# Patient Record
Sex: Female | Born: 1985 | Race: White | Hispanic: No | Marital: Married | Smoking: Never smoker
Health system: Southern US, Community
[De-identification: ages and names within clinical notes are randomized; demographics above are authoritative.]

---

## 2014-02-16 ENCOUNTER — Encounter (HOSPITAL_BASED_OUTPATIENT_CLINIC_OR_DEPARTMENT_OTHER): Payer: Self-pay | Admitting: Emergency Medicine

## 2014-02-16 ENCOUNTER — Emergency Department (HOSPITAL_BASED_OUTPATIENT_CLINIC_OR_DEPARTMENT_OTHER): Payer: Federal, State, Local not specified - PPO

## 2014-02-16 ENCOUNTER — Emergency Department (HOSPITAL_BASED_OUTPATIENT_CLINIC_OR_DEPARTMENT_OTHER)
Admission: EM | Admit: 2014-02-16 | Discharge: 2014-02-16 | Disposition: A | Discharge status: Home / Self Care | Payer: Federal, State, Local not specified - PPO | Attending: Emergency Medicine | Admitting: Emergency Medicine

## 2014-02-16 DIAGNOSIS — K5900 Constipation, unspecified: Secondary | ICD-10-CM | POA: Insufficient documentation

## 2014-02-16 DIAGNOSIS — R110 Nausea: Secondary | ICD-10-CM | POA: Insufficient documentation

## 2014-02-16 DIAGNOSIS — R109 Unspecified abdominal pain: Secondary | ICD-10-CM | POA: Insufficient documentation

## 2014-02-16 DIAGNOSIS — K219 Gastro-esophageal reflux disease without esophagitis: Secondary | ICD-10-CM | POA: Insufficient documentation

## 2014-02-16 DIAGNOSIS — Z3202 Encounter for pregnancy test, result negative: Secondary | ICD-10-CM | POA: Insufficient documentation

## 2014-02-16 DIAGNOSIS — R1915 Other abnormal bowel sounds: Secondary | ICD-10-CM | POA: Insufficient documentation

## 2014-02-16 DIAGNOSIS — F411 Generalized anxiety disorder: Secondary | ICD-10-CM | POA: Insufficient documentation

## 2014-02-16 LAB — CBC WITH DIFFERENTIAL/PLATELET
BASOS ABS: 0 10*3/uL (ref 0.0–0.1)
Basophils Relative: 0 % (ref 0–1)
EOS PCT: 2 % (ref 0–5)
Eosinophils Absolute: 0.1 10*3/uL (ref 0.0–0.7)
HCT: 36.2 % (ref 36.0–46.0)
Hemoglobin: 11.7 g/dL — ABNORMAL LOW (ref 12.0–15.0)
LYMPHS ABS: 2.4 10*3/uL (ref 0.7–4.0)
LYMPHS PCT: 33 % (ref 12–46)
MCH: 28.3 pg (ref 26.0–34.0)
MCHC: 32.3 g/dL (ref 30.0–36.0)
MCV: 87.7 fL (ref 78.0–100.0)
Monocytes Absolute: 0.9 10*3/uL (ref 0.1–1.0)
Monocytes Relative: 13 % — ABNORMAL HIGH (ref 3–12)
NEUTROS ABS: 3.9 10*3/uL (ref 1.7–7.7)
Neutrophils Relative %: 53 % (ref 43–77)
PLATELETS: 129 10*3/uL — AB (ref 150–400)
RBC: 4.13 MIL/uL (ref 3.87–5.11)
RDW: 12.8 % (ref 11.5–15.5)
WBC: 7.4 10*3/uL (ref 4.0–10.5)

## 2014-02-16 LAB — TROPONIN I: Troponin I: <0.3 ng/mL (ref <0.30)

## 2014-02-16 LAB — COMPREHENSIVE METABOLIC PANEL
ALT: 10 U/L (ref 0–35)
AST: 16 U/L (ref 0–37)
Albumin: 4.2 g/dL (ref 3.5–5.2)
Alkaline Phosphatase: 55 U/L (ref 39–117)
BILIRUBIN TOTAL: 0.7 mg/dL (ref 0.3–1.2)
BUN: 12 mg/dL (ref 6–23)
CALCIUM: 9.2 mg/dL (ref 8.4–10.5)
CO2: 24 meq/L (ref 19–32)
Chloride: 104 mEq/L (ref 96–112)
Creatinine, Ser: 0.7 mg/dL (ref 0.50–1.10)
GLUCOSE: 93 mg/dL (ref 70–99)
Potassium: 3.8 mEq/L (ref 3.7–5.3)
SODIUM: 140 meq/L (ref 137–147)
Total Protein: 7.3 g/dL (ref 6.0–8.3)

## 2014-02-16 LAB — PREGNANCY, URINE: PREG TEST UR: NEGATIVE

## 2014-02-16 LAB — LIPASE, BLOOD: Lipase: 21 U/L (ref 11–59)

## 2014-02-16 MED ORDER — DICYCLOMINE HCL 10 MG/ML IM SOLN
20.0000 mg | Freq: Once | INTRAMUSCULAR | Status: AC
  Administered 2014-02-16: 20 mg via INTRAMUSCULAR
  Filled 2014-02-16: qty 2

## 2014-02-16 MED ORDER — SUCRALFATE 1 GM/10ML PO SUSP
1.0000 g | Freq: Three times a day (TID) | ORAL | Status: DC
Start: 2014-02-16 — End: 2017-08-16

## 2014-02-16 MED ORDER — GI COCKTAIL ~~LOC~~
30.0000 mL | Freq: Once | ORAL | Status: AC
  Administered 2014-02-16: 30 mL via ORAL
  Filled 2014-02-16: qty 30

## 2014-02-16 MED ORDER — OMEPRAZOLE 20 MG PO CPDR: 20.0000 mg | DELAYED_RELEASE_CAPSULE | Freq: Every day | ORAL | Status: DC

## 2014-02-16 MED ORDER — ONDANSETRON 8 MG PO TBDP
8.0000 mg | ORAL_TABLET | Freq: Once | ORAL | Status: AC
  Administered 2014-02-16: 8 mg via ORAL
  Filled 2014-02-16: qty 1

## 2014-02-16 NOTE — ED Notes (Signed)
Intermittent mid chest pain, onset today, radiates to the back between the scapulas. Nausea/bloating/burping gas all week. Hx of heart palpitation in the past, has a cardiologist. Sinus rhythm, VSS, Chest non-tender at palpation.

## 2014-02-16 NOTE — ED Provider Notes (Signed)
CSN: [id number]     Arrival date & time 02/16/14  4098 History   First MD Initiated Contact with Patient 02/16/14 [phone]     Chief Complaint  Patient presents with  . Chest Pain     (Consider location/radiation/quality/duration/timing/severity/associated sxs/prior Treatment) Patient is a 28 y.o. female presenting with chest pain. The history is provided by the patient.  Chest Pain Pain location:  Epigastric Pain quality: sharp   Pain radiates to:  Mid back Pain severity:  Moderate Onset quality:  Gradual Duration:  5 days Timing:  Constant Progression:  Unchanged Context: no stress   Relieved by:  Nothing Worsened by:  Nothing tried Ineffective treatments:  None tried Associated symptoms: nausea   Associated symptoms: no cough, no diaphoresis, no lower extremity edema, no palpitations, no shortness of breath and not vomiting   Nausea:    Severity:  Severe   Onset quality:  Gradual   Duration:  1 week   Timing:  Constant   Progression:  Unchanged Risk factors: no birth control, no high cholesterol, no smoking and no surgery   Bloating and gassy x1 week.  Had palpitations and similar symptoms in October and was seen by cardiology who told her her cholesterol EKK and tests were normal.    History reviewed. No pertinent past medical history. Past Surgical History  Procedure Laterality Date  . Cesarean section     No family history on file. History  Substance Use Topics  . Smoking status: Never Smoker   . Smokeless tobacco: Not on file  . Alcohol Use: No   OB History   Grav Para Term Preterm Abortions TAB SAB Ect Mult Living                 Review of Systems  Constitutional: Negative for diaphoresis.  Respiratory: Negative for cough, chest tightness and shortness of breath.   Cardiovascular: Positive for chest pain. Negative for palpitations and leg swelling.  Gastrointestinal: Positive for nausea and abdominal distention. Negative for vomiting, diarrhea and  constipation.  All other systems reviewed and are negative.      Allergies  Review of patient's allergies indicates no known allergies.  Home Medications  No current outpatient prescriptions on file. BP 127/71  Pulse 79  Temp(Src) 98.3 F (36.8 C) (Oral)  Resp 25  SpO2 100% Physical Exam  Constitutional: She is oriented to person, place, and time. She appears well-developed and well-nourished. No distress.  HENT:  Head: Normocephalic and atraumatic.  Mouth/Throat: Oropharynx is clear and moist.  Eyes: Conjunctivae are normal. Pupils are equal, round, and reactive to light.  Neck: Normal range of motion. Neck supple.  Cardiovascular: Normal rate, regular rhythm and intact distal pulses.   Pulmonary/Chest: Effort normal and breath sounds normal. She has no wheezes. She has no rales. She exhibits no tenderness.  Abdominal: Soft. Bowel sounds are increased. There is no tenderness. There is no rebound and no guarding.  Musculoskeletal: Normal range of motion.  Neurological: She is alert and oriented to person, place, and time.  Skin: Skin is warm and dry.  Psychiatric: Her mood appears anxious.    ED Course  Procedures (including critical care time) Labs Review Labs Reviewed  PREGNANCY, URINE  CBC WITH DIFFERENTIAL  COMPREHENSIVE METABOLIC PANEL  LIPASE, BLOOD   Imaging Review No results found.   EKG Interpretation None      MDM  PERC negative wells 0- doubt PE no risk factors  Final diagnoses:  None   Do not  think this patient's symptoms are cardiac in nature.  In the setting of negative EKG and troponin ACS is excluded.  Symptoms are consistent with GERD.  Follow up with your PMD for ongoing care.      Date: 02/16/2014  Rate: 79  Rhythm: normal sinus rhythm  QRS Axis: normal  Intervals: normal  ST/T Wave abnormalities: normal  Conduction Disutrbances: none  Narrative Interpretation: unremarkable       Zakyia Gagan K Jovonda Selner-Rasch, MD 02/16/14 [phone]

## 2015-08-27 IMAGING — CR DG ABDOMEN ACUTE W/ 1V CHEST
3 series · 3 of 3 positions shown · non-contrast
Comparison: Insert none of the

CLINICAL DATA: Nausea, pain.

EXAM:
ACUTE ABDOMEN SERIES (ABDOMEN 2 VIEW & CHEST 1 VIEW)

[w chest pa]
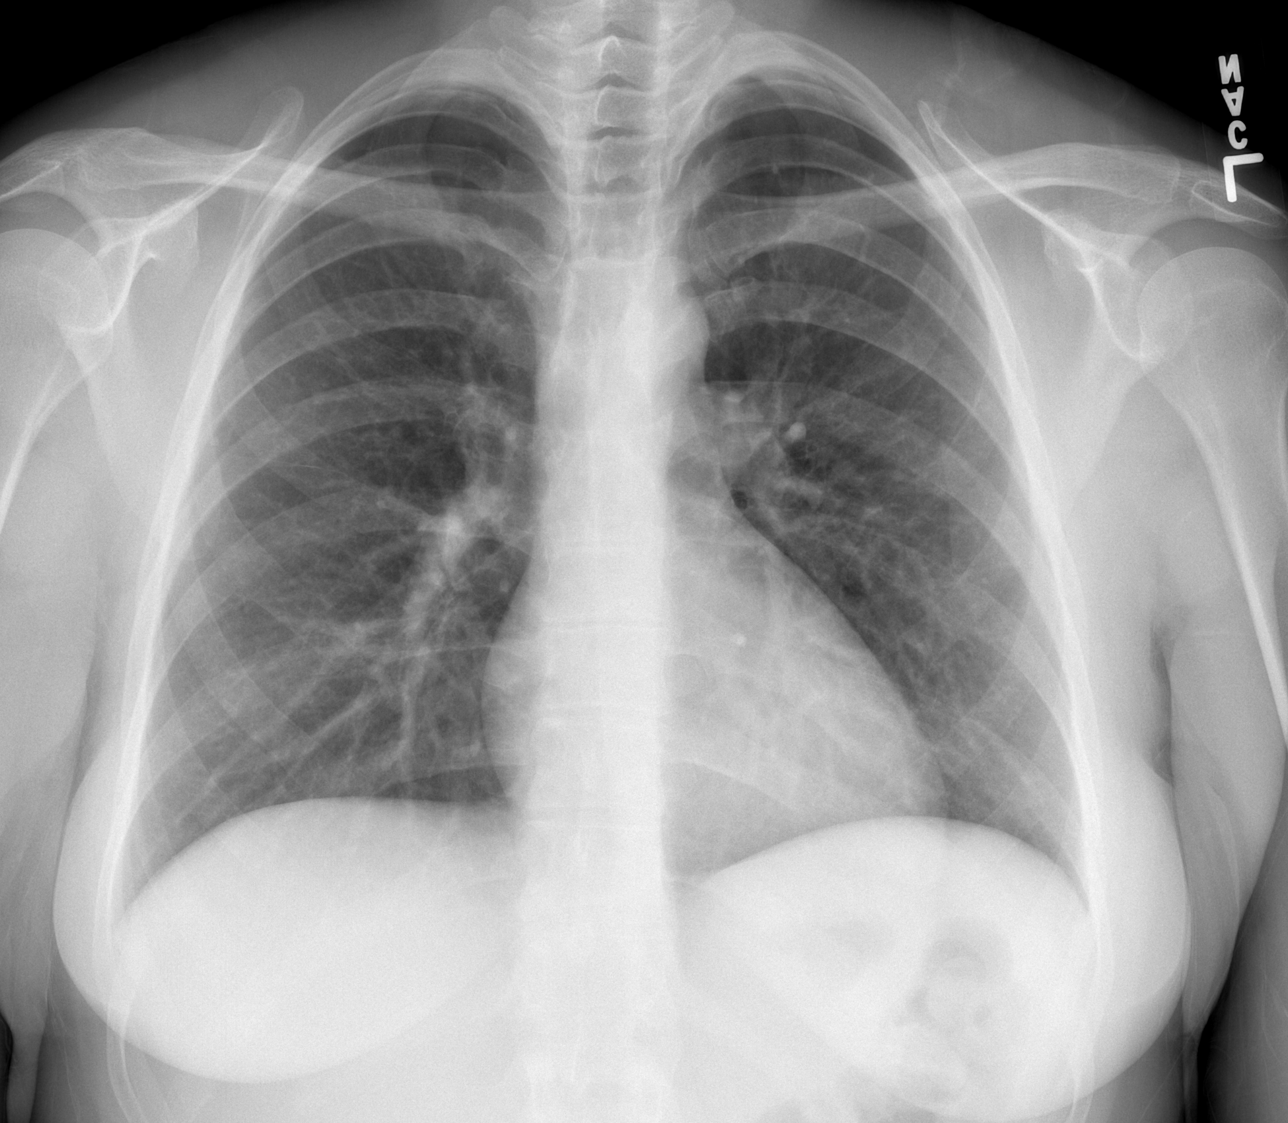

[w abdomen upright]
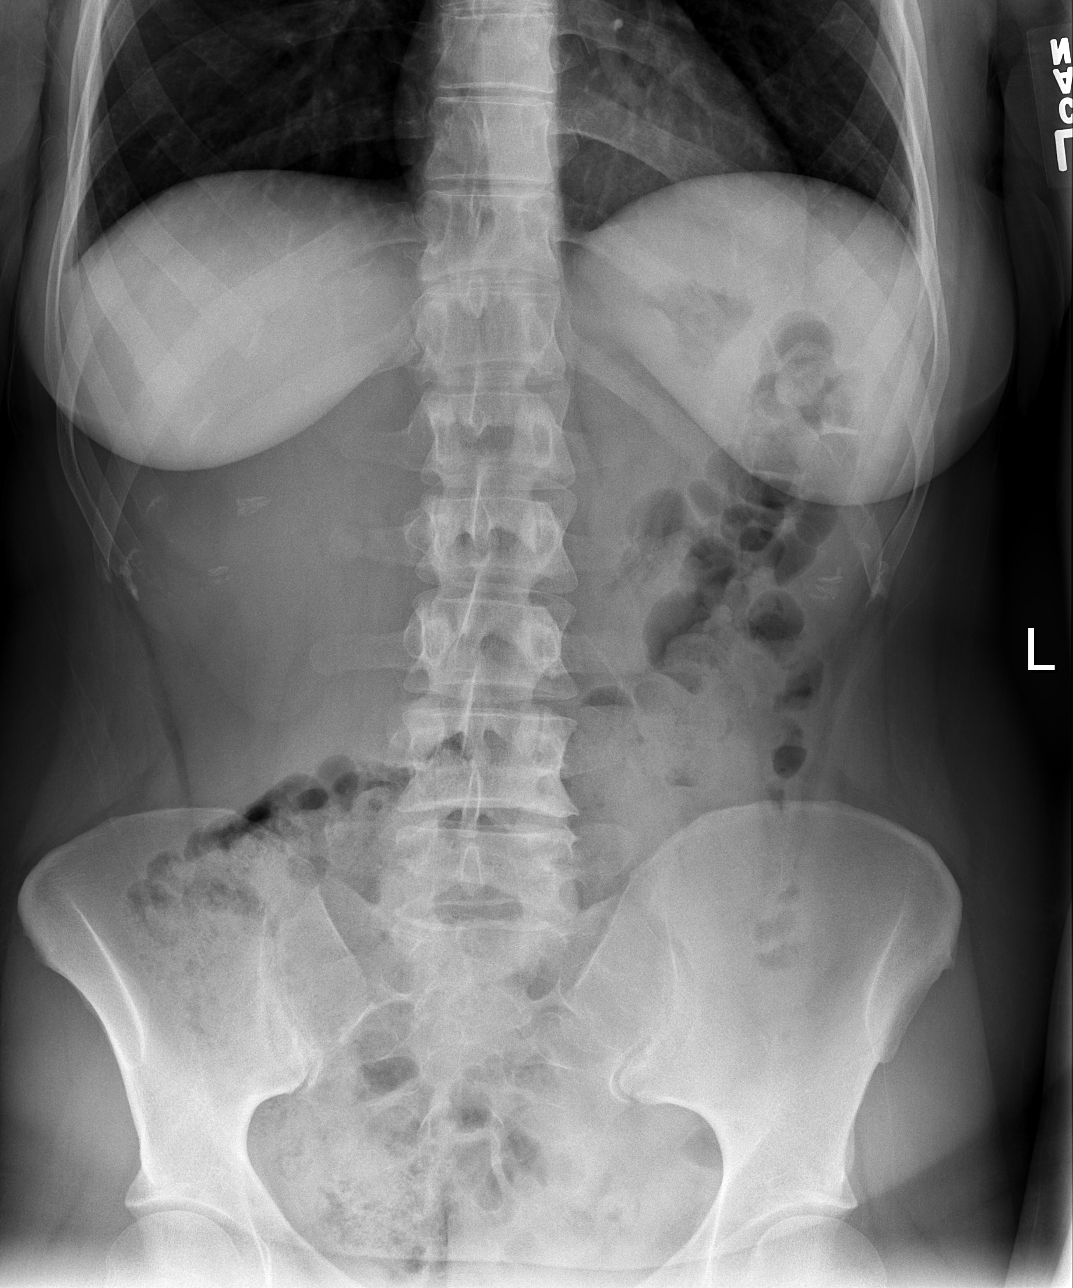

[t abdomen supine]
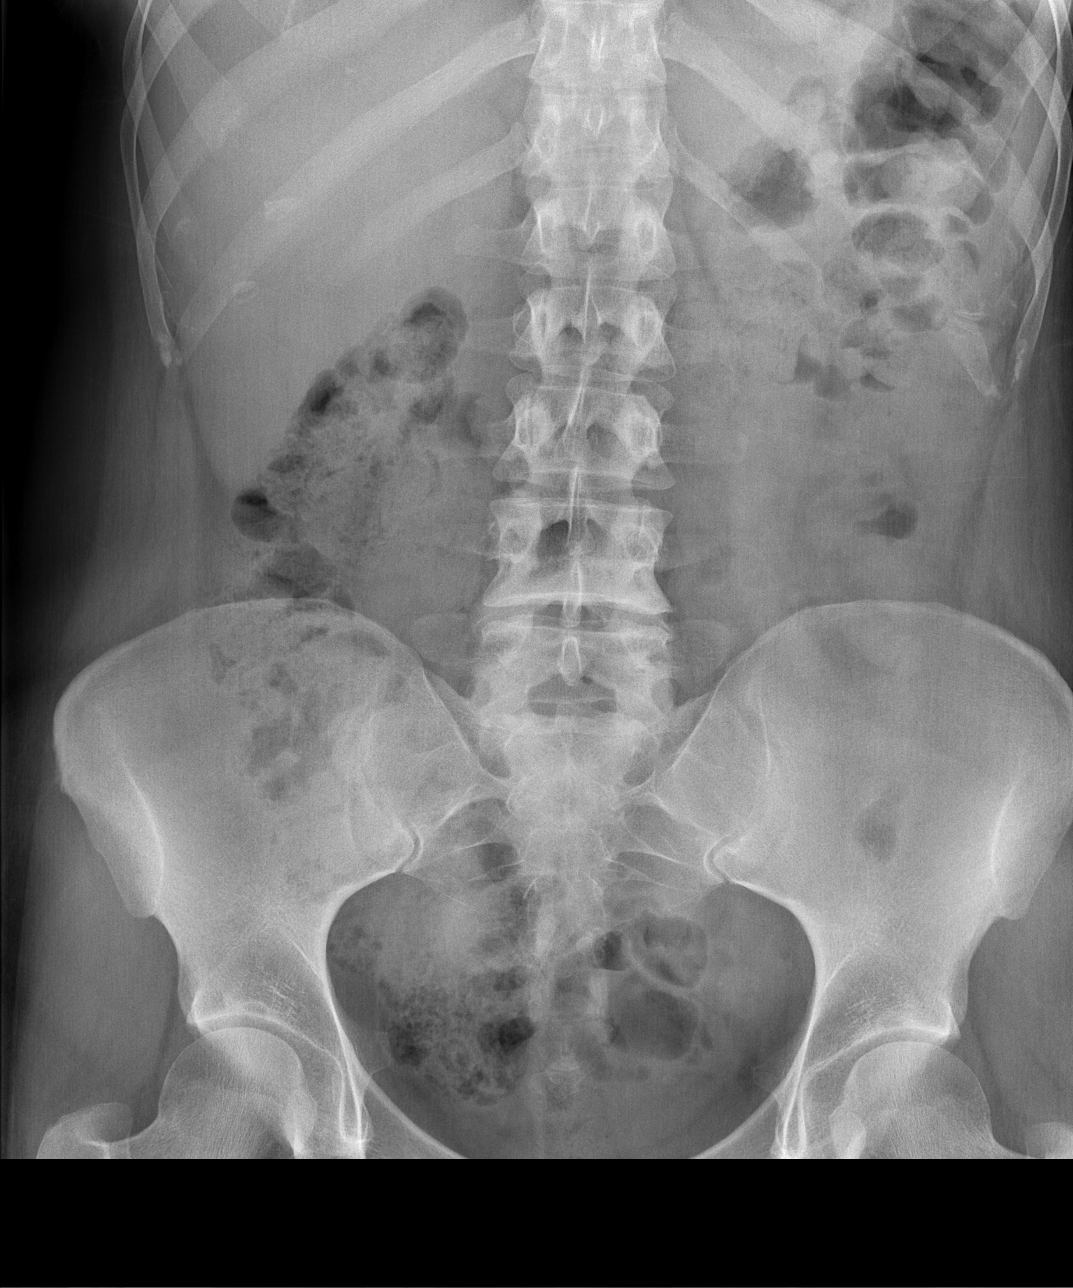

[3 of 3 positions shown; findings below may reference images not displayed]

FINDINGS: There is no evidence of dilated bowel loops or free intraperitoneal
air. No radiopaque calculi or other significant radiographic
abnormality is seen. Heart size and mediastinal contours are within
normal limits. Both lungs are clear.
IMPRESSION: Negative abdominal radiographs.  No acute cardiopulmonary disease.

  By: Md Ashiqul Uzzol

## 2017-08-16 ENCOUNTER — Encounter (HOSPITAL_BASED_OUTPATIENT_CLINIC_OR_DEPARTMENT_OTHER): Payer: Self-pay

## 2017-08-16 ENCOUNTER — Emergency Department (HOSPITAL_BASED_OUTPATIENT_CLINIC_OR_DEPARTMENT_OTHER)
Admission: EM | Admit: 2017-08-16 | Discharge: 2017-08-16 | Disposition: A | Discharge status: Home / Self Care | Payer: Federal, State, Local not specified - PPO | Attending: Emergency Medicine | Admitting: Emergency Medicine

## 2017-08-16 DIAGNOSIS — R1032 Left lower quadrant pain: Secondary | ICD-10-CM | POA: Insufficient documentation

## 2017-08-16 LAB — URINALYSIS, ROUTINE W REFLEX MICROSCOPIC
Bilirubin Urine: NEGATIVE
GLUCOSE, UA: NEGATIVE mg/dL
Hgb urine dipstick: NEGATIVE
Ketones, ur: NEGATIVE mg/dL
Leukocytes, UA: NEGATIVE
Nitrite: NEGATIVE
Protein, ur: NEGATIVE mg/dL
pH: 6.5 (ref 5.0–8.0)

## 2017-08-16 LAB — PREGNANCY, URINE: Preg Test, Ur: NEGATIVE

## 2017-08-16 MED ORDER — AMOXICILLIN-POT CLAVULANATE 875-125 MG PO TABS: 1.0000 | ORAL_TABLET | Freq: Two times a day (BID) | ORAL | 0 refills | Status: AC

## 2017-08-16 MED ORDER — ONDANSETRON 4 MG PO TBDP: ORAL_TABLET | ORAL | 0 refills | Status: AC

## 2017-08-16 MED ORDER — MORPHINE SULFATE 15 MG PO TABS: 15.0000 mg | ORAL_TABLET | ORAL | 0 refills | Status: AC | PRN

## 2017-08-16 NOTE — ED Notes (Signed)
Pt verbalizes understanding of d/c instructions and denies any further needs at this time. 

## 2017-08-16 NOTE — ED Triage Notes (Signed)
C/o lower abd/back pain started yesterday-pt LMP 8/24-denies n/v/d-NAD-steady gait

## 2017-08-16 NOTE — ED Notes (Signed)
EDP at bedside  

## 2017-08-16 NOTE — ED Provider Notes (Signed)
[facility]   CSN: [id number] Arrival date & time: 08/16/17  1916     History   Chief Complaint Chief Complaint  Patient presents with  . Abdominal Pain    HPI Lindsey Jennings is a 31 y.o. female.  31 yo F with a chief complaint of left lower quadrant abdominal pain. Going on for the past couple days. Worse with rolling over in the bed and palpation. Also worse with lying back flat. Denies fever chills denies vomiting or diarrhea. Denies dysuria or increased frequency or hesitancy. Denies hematuria. Denies constipation. Pain is somewhat better when she is upright and walking around. Never had pain like this before. Denies prior surgery.Denies radiation   The history is provided by the patient.  Abdominal Pain   This is a new problem. The current episode started 2 days ago. The problem occurs constantly. The problem has been gradually worsening. The pain is associated with an unknown factor. The pain is located in the LLQ. The quality of the pain is sharp and shooting. The pain is at a severity of 7/10. The pain is moderate. Pertinent negatives include fever, nausea, vomiting, dysuria, headaches, arthralgias and myalgias. The symptoms are aggravated by palpation and certain positions.    History reviewed. No pertinent past medical history.  There are no active problems to display for this patient.   Past Surgical History:  Procedure Laterality Date  . CESAREAN SECTION      OB History    No data available       Home Medications    Prior to Admission medications   Medication Sig Start Date End Date Taking? Authorizing Provider  amoxicillin-clavulanate (AUGMENTIN) 875-125 MG tablet Take 1 tablet by mouth every 12 (twelve) hours. 08/16/17   Melene PlanFloyd, Exilda Wilhite, DO  morphine (MSIR) 15 MG tablet Take 1 tablet (15 mg total) by mouth every 4 (four) hours as needed for severe pain. 08/16/17   Melene PlanFloyd, Desman Polak, DO  ondansetron (ZOFRAN ODT) 4 MG disintegrating tablet 4mg   ODT q4 hours prn nausea/vomit 08/16/17   Melene PlanFloyd, Dainelle Hun, DO    Family History No family history on file.  Social History Social History  Substance Use Topics  . Smoking status: Never Smoker  . Smokeless tobacco: Never Used  . Alcohol use No     Allergies   Patient has no known allergies.   Review of Systems Review of Systems  Constitutional: Negative for chills and fever.  HENT: Negative for congestion and rhinorrhea.   Eyes: Negative for redness and visual disturbance.  Respiratory: Negative for shortness of breath and wheezing.   Cardiovascular: Negative for chest pain and palpitations.  Gastrointestinal: Positive for abdominal pain. Negative for nausea and vomiting.  Genitourinary: Negative for dysuria and urgency.  Musculoskeletal: Negative for arthralgias and myalgias.  Skin: Negative for pallor and wound.  Neurological: Negative for dizziness and headaches.     Physical Exam Updated Vital Signs BP 102/67 (BP Location: Right Arm)   Pulse 84   Temp 98.3 F (36.8 C) (Oral)   Resp 20   Ht 5\' 6"  (1.676 m)   Wt 88.9 kg (195 lb 15.8 oz)   LMP 08/11/2017   SpO2 100%   BMI 31.63 kg/m   Physical Exam  Constitutional: She is oriented to person, place, and time. She appears well-developed and well-nourished. No distress.  HENT:  Head: Normocephalic and atraumatic.  Eyes: Pupils are equal, round, and reactive to light. EOM are normal.  Neck: Normal range of motion.  Neck supple.  Cardiovascular: Normal rate and regular rhythm.  Exam reveals no gallop and no friction rub.   No murmur heard. Pulmonary/Chest: Effort normal. She has no wheezes. She has no rales.  Abdominal: Soft. She exhibits no distension and no mass. There is tenderness (diffuse lower, wost to the LLQ). There is no guarding.  Musculoskeletal: She exhibits no edema or tenderness.  Neurological: She is alert and oriented to person, place, and time.  Skin: Skin is warm and dry. She is not diaphoretic.    Psychiatric: She has a normal mood and affect. Her behavior is normal.  Nursing note and vitals reviewed.    ED Treatments / Results  Labs (all labs ordered are listed, but only abnormal results are displayed) Labs Reviewed  URINALYSIS, ROUTINE W REFLEX MICROSCOPIC - Abnormal; Notable for the following:       Result Value   Specific Gravity, Urine <1.005 (*)    All other components within normal limits  PREGNANCY, URINE    EKG  EKG Interpretation None       Radiology No results found.  Procedures Procedures (including critical care time)  Medications Ordered in ED Medications - No data to display   Initial Impression / Assessment and Plan / ED Course  I have reviewed the triage vital signs and the nursing notes.  Pertinent labs & imaging results that were available during my care of the patient were reviewed by me and considered in my medical decision making (see chart for details).     31 yo F With a chief complaints of left lower quadrant abdominal pain. Going on for the past couple days. Afebrile not vomiting. Significant pain diffusely about the lower abdomen. Patient deferring pelvic exam at this time. I discussed the possible etiologies in the left lower quadrant and that it would likely involve advanced imaging to fully diagnose. 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 I discussed the likely timelines obtain labs and a CT scan. At this point she elects to be discharged home. We will treat presumptively for possible diverticulitis. Strict return precautions.  10:51 PM:  I have discussed the diagnosis/risks/treatment options with the patient and believe the pt to be eligible for discharge home to follow-up with PCP. We also discussed returning to the ED immediately if new or worsening sx occur. We discussed the sx which are most concerning (e.g., sudden worsening pain, fever, inability to tolerate by mouth) that necessitate immediate return. Medications  administered to the patient during their visit and any new prescriptions provided to the patient are listed below.  Medications given during this visit Medications - No data to display   The patient appears reasonably screen and/or stabilized for discharge and I doubt any other medical condition or other [facility] requiring further screening, evaluation, or treatment in the ED at this time prior to discharge.    Final Clinical Impressions(s) / ED Diagnoses   Final diagnoses:  LLQ abdominal pain    New Prescriptions New Prescriptions   AMOXICILLIN-CLAVULANATE (AUGMENTIN) 875-125 MG TABLET    Take 1 tablet by mouth every 12 (twelve) hours.   MORPHINE (MSIR) 15 MG TABLET    Take 1 tablet (15 mg total) by mouth every 4 (four) hours as needed for severe pain.   ONDANSETRON (ZOFRAN ODT) 4 MG DISINTEGRATING TABLET    4mg  ODT q4 hours prn nausea/vomit     Melene Plan, DO 08/16/17 2251
